# Patient Record
Sex: Male | Born: 1940 | Race: White | Hispanic: No | Marital: Married | Smoking: Former smoker
Health system: Southern US, Community
[De-identification: ages and names within clinical notes are randomized; demographics above are authoritative.]

## PROBLEM LIST (undated history)

## (undated) DIAGNOSIS — N138 Other obstructive and reflux uropathy: Secondary | ICD-10-CM

## (undated) DIAGNOSIS — N401 Enlarged prostate with lower urinary tract symptoms: Secondary | ICD-10-CM

## (undated) DIAGNOSIS — R972 Elevated prostate specific antigen [PSA]: Secondary | ICD-10-CM

## (undated) DIAGNOSIS — K219 Gastro-esophageal reflux disease without esophagitis: Secondary | ICD-10-CM

## (undated) DIAGNOSIS — N200 Calculus of kidney: Secondary | ICD-10-CM

## (undated) DIAGNOSIS — Z8639 Personal history of other endocrine, nutritional and metabolic disease: Secondary | ICD-10-CM

## (undated) DIAGNOSIS — E785 Hyperlipidemia, unspecified: Secondary | ICD-10-CM

## (undated) DIAGNOSIS — I10 Essential (primary) hypertension: Secondary | ICD-10-CM

## (undated) HISTORY — PX: CYSTOSCOPY W/ LITHOLAPAXY / EHL: SUR377

## (undated) HISTORY — DX: Hyperlipidemia, unspecified: E78.5

## (undated) HISTORY — DX: Personal history of other endocrine, nutritional and metabolic disease: Z86.39

## (undated) HISTORY — DX: Gastro-esophageal reflux disease without esophagitis: K21.9

## (undated) HISTORY — DX: Other obstructive and reflux uropathy: N13.8

## (undated) HISTORY — PX: PROSTATE BIOPSY: SHX241

## (undated) HISTORY — DX: Elevated prostate specific antigen (PSA): R97.20

## (undated) HISTORY — DX: Benign prostatic hyperplasia with lower urinary tract symptoms: N40.1

## (undated) HISTORY — DX: Essential (primary) hypertension: I10

## (undated) HISTORY — DX: Calculus of kidney: N20.0

## (undated) HISTORY — PX: CATARACT EXTRACTION: SUR2

---

## 2012-10-22 DIAGNOSIS — R972 Elevated prostate specific antigen [PSA]: Secondary | ICD-10-CM

## 2012-10-22 HISTORY — DX: Elevated prostate specific antigen (PSA): R97.20

## 2012-10-28 DIAGNOSIS — N138 Other obstructive and reflux uropathy: Secondary | ICD-10-CM

## 2012-10-28 HISTORY — DX: Other obstructive and reflux uropathy: N13.8

## 2014-10-06 DIAGNOSIS — E538 Deficiency of other specified B group vitamins: Secondary | ICD-10-CM | POA: Insufficient documentation

## 2014-10-06 DIAGNOSIS — Z8639 Personal history of other endocrine, nutritional and metabolic disease: Secondary | ICD-10-CM | POA: Insufficient documentation

## 2014-10-06 HISTORY — DX: Personal history of other endocrine, nutritional and metabolic disease: Z86.39

## 2014-11-16 DIAGNOSIS — K219 Gastro-esophageal reflux disease without esophagitis: Secondary | ICD-10-CM

## 2014-11-16 DIAGNOSIS — E785 Hyperlipidemia, unspecified: Secondary | ICD-10-CM

## 2014-11-16 DIAGNOSIS — I10 Essential (primary) hypertension: Secondary | ICD-10-CM

## 2014-11-16 DIAGNOSIS — M5136 Other intervertebral disc degeneration, lumbar region: Secondary | ICD-10-CM | POA: Insufficient documentation

## 2014-11-16 DIAGNOSIS — R0789 Other chest pain: Secondary | ICD-10-CM | POA: Insufficient documentation

## 2014-11-16 HISTORY — DX: Gastro-esophageal reflux disease without esophagitis: K21.9

## 2014-11-16 HISTORY — DX: Essential (primary) hypertension: I10

## 2014-11-16 HISTORY — DX: Hyperlipidemia, unspecified: E78.5

## 2015-07-05 DIAGNOSIS — N200 Calculus of kidney: Secondary | ICD-10-CM

## 2015-07-05 HISTORY — DX: Calculus of kidney: N20.0

## 2017-11-26 ENCOUNTER — Other Ambulatory Visit: Payer: Medicare Other

## 2017-11-26 DIAGNOSIS — R972 Elevated prostate specific antigen [PSA]: Secondary | ICD-10-CM

## 2017-11-27 LAB — PSA: PROSTATE SPECIFIC AG, SERUM: 5.2 ng/mL — AB (ref 0.0–4.0)

## 2017-11-29 ENCOUNTER — Encounter: Payer: Self-pay | Admitting: Urology

## 2017-11-29 ENCOUNTER — Ambulatory Visit
Admission: RE | Admit: 2017-11-29 | Discharge: 2017-11-29 | Disposition: A | Discharge status: Home / Self Care | Payer: Medicare Other | Source: Ambulatory Visit | Attending: Urology | Admitting: Urology

## 2017-11-29 ENCOUNTER — Ambulatory Visit: Payer: Medicare Other | Admitting: Urology

## 2017-11-29 VITALS — BP 146/82 | HR 73 | Ht 74.0 in | Wt 203.0 lb

## 2017-11-29 DIAGNOSIS — N200 Calculus of kidney: Secondary | ICD-10-CM

## 2017-11-29 DIAGNOSIS — R972 Elevated prostate specific antigen [PSA]: Secondary | ICD-10-CM

## 2017-11-29 NOTE — Progress Notes (Signed)
11/29/2017 12:34 PM   Cristian Wolfe. Jul 28, 1941 [id number]  Referring provider: No referring provider defined for this encounter.  Chief Complaint  Patient presents with  . Elevated PSA    1year  . Nephrolithiasis   Urologic problem list:  -Elevated PSA (prostate biopsy 2006 for PSA 6.63 with benign pathology; prostate volume 56 cc)  -Nephrolithiasis (nonobstructing bilateral renal calculi)  HPI: 77 year old male presents for follow-up of the above problem list.  I last saw him at [facility] on 11/27/2016.  He has no bothersome lower urinary tract symptoms.  Denies dysuria or gross hematuria.  Denies flank, abdominal, pelvic or scrotal pain.  PSA last year was 5.31.  His most recent PSA drawn on 11/26/2017 is stable at 5.2.  He had been on doxazosin but was having side effects.  His doxazosin dose was decreased and he was recently started on finasteride by Dr. Judithann Sheen.   PMH: Past Medical History:  Diagnosis Date  . 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 Elevated prostate specific antigen (PSA) 10/22/2012  . Gastro-esophageal reflux disease without esophagitis 11/16/2014  . Hyperlipidemia 11/16/2014  . Hypertension 11/16/2014  . Nephrolithiasis 07/05/2015  . Personal history of other endocrine, nutritional and metabolic disease 4/0/9811    Surgical History: Past Surgical History:  Procedure Laterality Date  . CATARACT EXTRACTION    . CYSTOSCOPY W/ LITHOLAPAXY / EHL    . PROSTATE BIOPSY      Home Medications:  Allergies as of 11/29/2017      Reactions   Iodinated Diagnostic Agents    Other reaction(s): UNKNOWN      Medication List        Accurate as of 11/29/17 12:34 PM. Always use your most recent med list.          aspirin EC 81 MG tablet Take by mouth.   BYSTOLIC 10 MG tablet Generic drug:  nebivolol   D-5000 5000 units Tabs Generic drug:  Cholecalciferol Take by mouth.   doxazosin 1 MG tablet Commonly known as:  CARDURA Take by mouth.     finasteride 5 MG tablet Commonly known as:  PROSCAR Take by mouth.   ranitidine 75 MG tablet Commonly known as:  ZANTAC Take by mouth.   sildenafil 20 MG tablet Commonly known as:  REVATIO 2-5 tablets daily as needed for ED   vitamin B-12 1000 MCG tablet Commonly known as:  CYANOCOBALAMIN Take by mouth.       Allergies:  Allergies  Allergen Reactions  . Iodinated Diagnostic Agents     Other reaction(s): UNKNOWN    Family History: No family history on file.  Social History:  reports that he has quit smoking. He has never used smokeless tobacco. He reports that he drinks alcohol. He reports that he does not use drugs.  ROS: UROLOGY Frequent Urination?: No Hard to postpone urination?: No Burning/pain with urination?: No Get up at night to urinate?: Yes Leakage of urine?: No Urine stream starts and stops?: No Trouble starting stream?: No Do you have to strain to urinate?: No Blood in urine?: No Urinary tract infection?: No Sexually transmitted disease?: No Injury to kidneys or bladder?: No Painful intercourse?: No Weak stream?: No Erection problems?: No Penile pain?: No  Gastrointestinal Nausea?: No Vomiting?: No Indigestion/heartburn?: No Diarrhea?: No Constipation?: No  Constitutional Fever: No Night sweats?: Yes Weight loss?: No Fatigue?: No  Skin Skin rash/lesions?: No Itching?: No  Eyes Blurred vision?: No Double vision?: No  Ears/Nose/Throat Sore throat?: No Sinus problems?:  No  Hematologic/Lymphatic Swollen glands?: No Easy bruising?: No  Cardiovascular Leg swelling?: No Chest pain?: No  Respiratory Cough?: No Shortness of breath?: No  Endocrine Excessive thirst?: No  Musculoskeletal Back pain?: No Joint pain?: No  Neurological Headaches?: No Dizziness?: No  Psychologic Depression?: No Anxiety?: No  Physical Exam: BP (!) 146/82   Pulse 73   Ht 6\' 2"  (1.88 m)   Wt 203 lb (92.1 kg)   BMI 26.06 kg/m    Constitutional:  Alert and oriented, No acute distress. HEENT: [place] AT, moist mucus membranes.  Trachea midline, no masses. Cardiovascular: No clubbing, cyanosis, or edema. Respiratory: Normal respiratory effort, no increased work of breathing. GI: Abdomen is soft, nontender, nondistended, no abdominal masses GU: No CVA tenderness.  Prostate 60 g, smooth without nodules Lymph: No cervical or inguinal lymphadenopathy. Skin: No rashes, bruises or suspicious lesions. Neurologic: Grossly intact, no focal deficits, moving all 4 extremities. Psychiatric: Normal mood and affect.  Laboratory Data:  Urinalysis Dipstick/microscopy negative   Assessment & Plan:   77 year old male with an elevated PSA and previous benign prostate biopsy.  DRE is benign.  He was recently placed on finasteride and his most recent PSA has not reflected in expected 50% decrease.  Will recheck his PSA in 4 to 6 months.  KUB was ordered for follow-up of nephrolithiasis.   Return in about 1 year (around 11/30/2018) for Recheck, PSA, KUB.    Riki AltesScott C Jaishawn Witzke, MD  [facility] [address], [address] [place], [place] [address] [phone]

## 2017-11-30 ENCOUNTER — Telehealth: Payer: Self-pay | Admitting: Family Medicine

## 2017-11-30 ENCOUNTER — Encounter: Payer: Self-pay | Admitting: Family Medicine

## 2017-11-30 LAB — URINALYSIS, COMPLETE
Bilirubin, UA: NEGATIVE
GLUCOSE, UA: NEGATIVE
KETONES UA: NEGATIVE
LEUKOCYTES UA: NEGATIVE
NITRITE UA: NEGATIVE
PROTEIN UA: NEGATIVE
RBC UA: NEGATIVE
SPEC GRAV UA: 1.02 (ref 1.005–1.030)
Urobilinogen, Ur: 0.2 mg/dL (ref 0.2–1.0)
pH, UA: 6.5 (ref 5.0–7.5)

## 2017-11-30 NOTE — Telephone Encounter (Signed)
-----   Message from Riki AltesScott C Stoioff, MD sent at 11/30/2017  1:04 PM EDT ----- KUB showed stable, nonobstructing renal calculi.

## 2017-11-30 NOTE — Telephone Encounter (Signed)
Letter sent.

## 2018-11-20 ENCOUNTER — Telehealth: Payer: Self-pay | Admitting: Urology

## 2018-11-20 DIAGNOSIS — N200 Calculus of kidney: Secondary | ICD-10-CM

## 2018-11-20 NOTE — Telephone Encounter (Signed)
Patient has an upcoming app and needs a kub but there is no order. Can you put an order in please and thank you?   Cristian Wolfe

## 2018-12-02 ENCOUNTER — Other Ambulatory Visit: Payer: Self-pay | Admitting: Urology

## 2018-12-02 ENCOUNTER — Other Ambulatory Visit: Payer: Self-pay

## 2018-12-02 ENCOUNTER — Other Ambulatory Visit: Payer: Medicare Other

## 2018-12-02 ENCOUNTER — Ambulatory Visit
Admission: RE | Admit: 2018-12-02 | Discharge: 2018-12-02 | Disposition: A | Discharge status: Home / Self Care | Payer: Medicare Other | Attending: Urology | Admitting: Urology

## 2018-12-02 ENCOUNTER — Ambulatory Visit
Admission: RE | Admit: 2018-12-02 | Discharge: 2018-12-02 | Disposition: A | Discharge status: Home / Self Care | Payer: Medicare Other | Source: Ambulatory Visit | Attending: Urology | Admitting: Urology

## 2018-12-02 DIAGNOSIS — N200 Calculus of kidney: Secondary | ICD-10-CM | POA: Insufficient documentation

## 2018-12-02 DIAGNOSIS — R972 Elevated prostate specific antigen [PSA]: Secondary | ICD-10-CM

## 2018-12-03 LAB — PSA: Prostate Specific Ag, Serum: 6.8 ng/mL — ABNORMAL HIGH (ref 0.0–4.0)

## 2018-12-04 ENCOUNTER — Other Ambulatory Visit: Payer: Self-pay

## 2018-12-04 ENCOUNTER — Telehealth (INDEPENDENT_AMBULATORY_CARE_PROVIDER_SITE_OTHER): Payer: Medicare Other | Admitting: Urology

## 2018-12-04 DIAGNOSIS — N200 Calculus of kidney: Secondary | ICD-10-CM

## 2018-12-04 DIAGNOSIS — R972 Elevated prostate specific antigen [PSA]: Secondary | ICD-10-CM

## 2018-12-04 NOTE — Progress Notes (Signed)
Virtual Visit via Telephone Note  I connected with Army Fossa. on 12/04/18 at  9:00 AM EDT by telephone and verified that I am speaking with the correct person using two identifiers.   I discussed the limitations, risks, security and privacy concerns of performing an evaluation and management service by telephone and the availability of in person appointments. We discussed the impact of the COVID-19 on the healthcare system, and the importance of social distancing and reducing patient and provider exposure. I also discussed with the patient that there may be a patient responsible charge related to this service. The patient expressed understanding and agreed to proceed.  Urologic history: 1.  Elevated PSA  -Prostate biopsy 2006 for PSA 6.63 with benign pathology; prostate volume 56 cc  2.  Nephrolithiasis  -nonobstructing bilateral renal calculi  Reason for visit: Annual follow-up via telephone secondary to COVID-19 pandemic.  He did not have capability for a video visit.  History of Present Illness: 78 year old male followed for the above problem list.  He has had no problems since his last visit 1 year ago and states he is doing well.  He denies bothersome lower urinary tract symptoms. Denies dysuria, gross hematuria or flank/abdominal/pelvic/scrotal pain.  PSA drawn 12/02/2018 was 6.8.  KUB performed on the same date was reviewed.  He has small, bilateral renal calculi.  There does appear to be interval size change in a left midpole calculus.  There is a moderate amount of overlying stool and bowel gas.   Assessment and Plan: 1.  Elevated PSA  -His most recent PSA is slightly above baseline however over the years his PSA has fluctuated intermittently and was as high as 8.74 in 2016.  He desires to continue surveillance.  2.  Nephrolithiasis  -There does appear to be interval size change of a left midpole calculus.  He is asymptomatic.  I recommended scheduling a renal ultrasound  for further characterization.  Will schedule in the next 1 to 2 months and call with results.  Follow Up: As above   I discussed the assessment and treatment plan with the patient. The patient was provided an opportunity to ask questions and all were answered. The patient agreed with the plan and demonstrated an understanding of the instructions.   The patient was advised to call back or seek an in-person evaluation if the symptoms worsen or if the condition fails to improve as anticipated.  I provided 11 minutes of non-face-to-face time during this encounter.   Riki Altes, MD

## 2019-01-03 ENCOUNTER — Other Ambulatory Visit: Payer: Self-pay

## 2019-01-03 ENCOUNTER — Ambulatory Visit
Admission: RE | Admit: 2019-01-03 | Discharge: 2019-01-03 | Disposition: A | Discharge status: Home / Self Care | Payer: Medicare Other | Source: Ambulatory Visit | Attending: Urology | Admitting: Urology

## 2019-01-03 DIAGNOSIS — N200 Calculus of kidney: Secondary | ICD-10-CM | POA: Insufficient documentation

## 2019-01-04 ENCOUNTER — Other Ambulatory Visit: Payer: Self-pay | Admitting: Urology

## 2019-01-04 DIAGNOSIS — N200 Calculus of kidney: Secondary | ICD-10-CM

## 2019-01-06 ENCOUNTER — Telehealth: Payer: Self-pay | Admitting: Family Medicine

## 2019-01-06 NOTE — Telephone Encounter (Signed)
-----   Message from Riki Altes, MD sent at 01/04/2019  2:23 PM EDT ----- The left renal calculus has increased in size but not to the degree as suggested by his KUB.  We could treat with shockwave lithotripsy however continued surveillance is also an option as he is asymptomatic.  If he desires surveillance I would recommend a repeat KUB in 6 months and will call with results.

## 2019-01-06 NOTE — Telephone Encounter (Signed)
Patient notified and is not having symptoms at this time. He will plan to do the KUB in 6 months

## 2019-08-07 ENCOUNTER — Telehealth: Payer: Self-pay | Admitting: Urology

## 2019-08-07 NOTE — Telephone Encounter (Signed)
Patient sent a letter to you stating that he had recent labs done on 07-17-19 at Dr. Doy Hutching office and his PSA was 8.51. he said he was told that he needed to see you. He has a follow up app with you for labs and and OV in April. Does he need to see you before this? Please advise. He is supposed to be going for his follow up 6 month KUB in the next few days. I will put the letter on your desk   Sharyn Lull

## 2019-08-07 NOTE — Telephone Encounter (Signed)
The PSA is only slightly elevated above baseline.  We can repeat at his April appointment and he does not need to be seen earlier.

## 2019-08-11 ENCOUNTER — Ambulatory Visit
Admission: RE | Admit: 2019-08-11 | Discharge: 2019-08-11 | Disposition: A | Discharge status: Home / Self Care | Payer: Medicare PPO | Source: Ambulatory Visit | Attending: Urology | Admitting: Urology

## 2019-08-11 DIAGNOSIS — N200 Calculus of kidney: Secondary | ICD-10-CM | POA: Insufficient documentation

## 2019-08-16 ENCOUNTER — Other Ambulatory Visit: Payer: Self-pay | Admitting: Urology

## 2019-08-16 DIAGNOSIS — N210 Calculus in bladder: Secondary | ICD-10-CM

## 2019-08-18 ENCOUNTER — Telehealth: Payer: Self-pay

## 2019-08-18 NOTE — Telephone Encounter (Signed)
-----   Message from Riki Altes, MD sent at 08/16/2019 10:45 AM EST ----- KUB was reviewed.  I do not see the larger left lower pole renal calculus previously noted on KUB and ultrasound however there is a new calcification in the bladder region which is about the size of the prior stone seen in the kidney.  Recommend scheduling a ultrasound pelvis to see if the stone is now in the bladder.  Order was entered and will call with results.

## 2019-08-18 NOTE — Telephone Encounter (Signed)
Called pt informed him of the information below. Pt gave verbal understanding.  

## 2019-08-25 ENCOUNTER — Other Ambulatory Visit: Payer: Self-pay

## 2019-08-25 ENCOUNTER — Ambulatory Visit
Admission: RE | Admit: 2019-08-25 | Discharge: 2019-08-25 | Disposition: A | Discharge status: Home / Self Care | Payer: Medicare PPO | Source: Ambulatory Visit | Attending: Urology | Admitting: Urology

## 2019-08-25 DIAGNOSIS — N210 Calculus in bladder: Secondary | ICD-10-CM | POA: Insufficient documentation

## 2019-09-08 ENCOUNTER — Telehealth: Payer: Self-pay | Admitting: Urology

## 2019-09-08 NOTE — Telephone Encounter (Signed)
LM FOR PT TO CB TO CONFIRM APP  Cristian Wolfe

## 2019-09-08 NOTE — Telephone Encounter (Signed)
-----   Message from Riki Altes, MD sent at 09/07/2019 12:03 PM EST ----- Bladder ultrasound does show a 10 mm stone in the bladder.  Please set up a telephone visit to discuss options.  It does not have to be at the beginning or end of morning/afternoon

## 2019-09-10 ENCOUNTER — Other Ambulatory Visit: Payer: Self-pay

## 2019-09-10 ENCOUNTER — Telehealth (INDEPENDENT_AMBULATORY_CARE_PROVIDER_SITE_OTHER): Payer: Medicare PPO | Admitting: Urology

## 2019-09-10 DIAGNOSIS — N138 Other obstructive and reflux uropathy: Secondary | ICD-10-CM | POA: Diagnosis not present

## 2019-09-10 DIAGNOSIS — N401 Enlarged prostate with lower urinary tract symptoms: Secondary | ICD-10-CM

## 2019-09-10 DIAGNOSIS — N210 Calculus in bladder: Secondary | ICD-10-CM | POA: Diagnosis not present

## 2019-09-10 NOTE — Progress Notes (Signed)
Virtual Visit via Telephone Note  I connected with Cristian Wolfe. on 09/10/19 at  2:15 PM EST by telephone and verified that I am speaking with the correct person using two identifiers.  Location: Patient: Home Provider: Office, [facility]   I discussed the limitations, risks, security and privacy concerns of performing an evaluation and management service by telephone and the availability of in person appointments. I also discussed with the patient that there may be a patient responsible charge related to this service. The patient expressed understanding and agreed to proceed.   History of Present Illness: 79 y.o. male followed for nonobstructing renal calculi, elevated PSA and minimally symptomatic BPH.  On a recent KUB a new calcification was seen in the true bony pelvis suspicious for a bladder calculus.  A pelvic ultrasound did show this to be a stone which measures approximately 10 mm.  This looks like it may have migrated from the left kidney on x-ray/renal ultrasound performed March/April 2020.  He did not have any flank pain.  He has no voiding symptoms and denies gross hematuria. He did undergo cystolitholapaxy at [facility] 2016 but declined an outlet procedure.   Observations/Objective: N/A  Assessment and Plan: Asymptomatic bladder calculus.  We discussed options of cystolitholapaxy with an outlet procedure, cystolitholapaxy alone and observation which was not recommended as I think it is unlikely he will pass the stone.  He would like to think over these options.  Follow Up Instructions: Follow-up scheduled April 2021.  We will obtain a KUB prior to that appointment.  He will call earlier for any worsening lower urinary tract symptoms.   I discussed the assessment and treatment plan with the patient. The patient was provided an opportunity to ask questions and all were answered. The patient agreed with the plan and demonstrated an understanding of the instructions.    The patient was advised to call back or seek an in-person evaluation if the symptoms worsen or if the condition fails to improve as anticipated.  I provided 13 minutes of non-face-to-face time during this encounter.   Riki Altes, MD

## 2019-11-28 ENCOUNTER — Other Ambulatory Visit: Payer: Self-pay | Admitting: Family Medicine

## 2019-11-28 DIAGNOSIS — R972 Elevated prostate specific antigen [PSA]: Secondary | ICD-10-CM

## 2019-12-01 ENCOUNTER — Other Ambulatory Visit: Payer: Self-pay

## 2019-12-01 ENCOUNTER — Other Ambulatory Visit: Payer: Medicare PPO

## 2019-12-01 DIAGNOSIS — R972 Elevated prostate specific antigen [PSA]: Secondary | ICD-10-CM

## 2019-12-02 LAB — PSA: Prostate Specific Ag, Serum: 8.1 ng/mL — ABNORMAL HIGH (ref 0.0–4.0)

## 2019-12-04 ENCOUNTER — Ambulatory Visit
Admission: RE | Admit: 2019-12-04 | Discharge: 2019-12-04 | Disposition: A | Discharge status: Home / Self Care | Payer: Medicare PPO | Source: Ambulatory Visit | Attending: Urology | Admitting: Urology

## 2019-12-04 ENCOUNTER — Other Ambulatory Visit: Payer: Self-pay

## 2019-12-04 ENCOUNTER — Other Ambulatory Visit: Payer: Self-pay | Admitting: *Deleted

## 2019-12-04 ENCOUNTER — Telehealth (INDEPENDENT_AMBULATORY_CARE_PROVIDER_SITE_OTHER): Payer: Medicare PPO | Admitting: Urology

## 2019-12-04 ENCOUNTER — Encounter: Payer: Self-pay | Admitting: Urology

## 2019-12-04 DIAGNOSIS — R972 Elevated prostate specific antigen [PSA]: Secondary | ICD-10-CM | POA: Diagnosis not present

## 2019-12-04 DIAGNOSIS — N210 Calculus in bladder: Secondary | ICD-10-CM | POA: Diagnosis present

## 2019-12-04 DIAGNOSIS — N200 Calculus of kidney: Secondary | ICD-10-CM

## 2019-12-04 NOTE — Progress Notes (Signed)
Virtual Visit via Telephone Note  I connected with Cristian Wolfe. on 12/04/19 at 11:30 AM EDT by telephone and verified that I am speaking with the correct person using two identifiers.  Location: Patient: Home Provider: [facility]   I discussed the limitations, risks, security and privacy concerns of performing an evaluation and management service by telephone and the availability of in person appointments. I also discussed with the patient that there may be a patient responsible charge related to this service. The patient expressed understanding and agreed to proceed.   Urologic history: 1.  Elevated PSA -Prostate biopsy 2006 for PSA 6.63 with benign pathology; prostate volume 56 cc -Prostate MRI 03/2015 for bump PSA to 8.74 -Volume 122 cc; no adenopathy -PSA returned to baseline  2.  Nephrolithiasis -nonobstructing bilateral renal calculi  3.  Bladder calculi -Cystolitholapaxy 2 bladder calculi 06/2015 [facility]) -10 mm bladder calculus January 2021   History of Present Illness: 79 y.o. male presents for follow-up of an elevated PSA and recently diagnosed bladder calculus.  He has mild urinary frequency.  No dysuria, gross hematuria or obstructive voiding symptoms.  PSA trend:         KUB performed today was reviewed and there is a 10 mm calcification overlying the true bony pelvis which on previous ultrasound was found to be a bladder calculus.  Observations/Objective: N/A  Assessment and Plan:  - Bladder calculus Although presently asymptomatic I did inform Cristian Wolfe he will eventually need to have stone treated with cystolitholapaxy.  He would like to hold off until late summer or Fall.  Will check a KUB in approximately 3 months to evaluate for any interval size increase.  - Elevated PSA Most recent PSA was 8.1 which is lower than a PSA performed by his PCP in December 2020.  He had an MRI in 2016 for PSA bumped 8.74 which showed no suspicious  lesions.  He would like to continue surveillance and will recheck his PSA in approximately 6 months.   Follow Up Instructions: -KUB 3 months -PSA 6 months   I discussed the assessment and treatment plan with the patient. The patient was provided an opportunity to ask questions and all were answered. The patient agreed with the plan and demonstrated an understanding of the instructions.   The patient was advised to call back or seek an in-person evaluation if the symptoms worsen or if the condition fails to improve as anticipated.  I spent 35 total minutes on the day of the encounter including pre-visit review of the medical record, face-to-face time with the patient, and post visit ordering of labs/imaging/tests.    Cristian Altes, MD

## 2019-12-05 ENCOUNTER — Telehealth: Payer: Self-pay | Admitting: Urology

## 2019-12-05 NOTE — Telephone Encounter (Signed)
-----   Message from Riki Altes, MD sent at 12/04/2019  9:36 PM EDT ----- KUB in 3 months-will call with results oOffice visit/PSA 6 months

## 2019-12-05 NOTE — Telephone Encounter (Signed)
apps made and mailed to patient ° °Michelle °

## 2020-02-11 ENCOUNTER — Ambulatory Visit
Admission: RE | Admit: 2020-02-11 | Discharge: 2020-02-11 | Disposition: A | Discharge status: Home / Self Care | Payer: Medicare PPO | Attending: Urology | Admitting: Urology

## 2020-02-11 ENCOUNTER — Ambulatory Visit
Admission: RE | Admit: 2020-02-11 | Discharge: 2020-02-11 | Disposition: A | Discharge status: Home / Self Care | Payer: Medicare PPO | Source: Ambulatory Visit | Attending: Urology | Admitting: Urology

## 2020-02-11 DIAGNOSIS — N200 Calculus of kidney: Secondary | ICD-10-CM | POA: Diagnosis present

## 2020-02-11 DIAGNOSIS — N210 Calculus in bladder: Secondary | ICD-10-CM | POA: Insufficient documentation

## 2020-02-13 ENCOUNTER — Telehealth: Payer: Self-pay | Admitting: Family Medicine

## 2020-02-13 NOTE — Telephone Encounter (Signed)
LMOM for patient to return call.

## 2020-02-13 NOTE — Telephone Encounter (Signed)
Patient notified and voiced understanding.

## 2020-02-13 NOTE — Telephone Encounter (Signed)
-----   Message from Riki Altes, MD sent at 02/13/2020  9:37 AM EDT ----- Bladder calculus is stable and has not increased in size

## 2020-06-07 ENCOUNTER — Other Ambulatory Visit: Payer: Medicare PPO

## 2020-06-08 ENCOUNTER — Other Ambulatory Visit: Payer: Medicare PPO

## 2020-06-08 ENCOUNTER — Other Ambulatory Visit: Payer: Self-pay

## 2020-06-08 DIAGNOSIS — R972 Elevated prostate specific antigen [PSA]: Secondary | ICD-10-CM

## 2020-06-09 LAB — PSA: Prostate Specific Ag, Serum: 7 ng/mL — ABNORMAL HIGH (ref 0.0–4.0)

## 2020-06-09 NOTE — Progress Notes (Signed)
06/09/2020 9:32 AM   Army Fossa [birth year] [id number]  Referring provider: Marguarite Arbour, MD [address] [facility] [place],  [place] [address] Chief Complaint  Patient presents with  . Elevated PSA   Urologic history: 1.Elevated PSA -Prostate biopsy2006 for PSA 6.63 with benign pathology; prostate volume 56 cc -Prostate MRI 03/2015 for bump PSA to 8.74 -Volume 122 cc; no adenopathy -PSA returned to baseline  2.Nephrolithiasis -nonobstructing bilateral renal calculi  3.  Bladder calculi -Cystolitholapaxy 2 bladder calculi 06/2015 [facility]) -10 mm bladder calculus January 2021  HPI: Cristian Wolfe. is a 79 y.o. male who is seen today for a 6 month follow up of bladder calculus and elevated PSA.   -KUB form 07/07/021 noted scattered renal calculi bilaterally, essentially stable. Suspected bladder calculus measuring 1 x 1 cm. No bowel obstruction or free air evident. -Patient is doing well today. -He reports drinking plenty water with associated frequency. -Denies hematuria and dysuria. -No flank, abdominal or pelvic pain.  -PSA is 7.0 as of 06/08/2020.   PSA trend: 11/16/14: 6.3 03/22/15: 8.74 11/22/15/: 4.81 11/27/16: 5.31 05/25/17: 5.35 11/26/17: 5.2 12/02/18: 6.8 07/17/19: 8.51 12/01/19: 8.1 06/08/20: 7.0   PMH: Past Medical History:  Diagnosis Date  . 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 Elevated prostate specific antigen (PSA) 10/22/2012  . Gastro-esophageal reflux disease without esophagitis 11/16/2014  . Hyperlipidemia 11/16/2014  . Hypertension 11/16/2014  . Nephrolithiasis 07/05/2015  . Personal history of other endocrine, nutritional and metabolic disease 01/07/1307    Surgical History: Past Surgical History:  Procedure Laterality Date  . CATARACT EXTRACTION    . CYSTOSCOPY W/ LITHOLAPAXY / EHL    . PROSTATE BIOPSY      Home Medications:  Allergies as of 06/10/2020      Reactions   Iodinated Diagnostic Agents     Other reaction(s): UNKNOWN      Medication List       Accurate as of June 10, 2020  9:32 AM. If you have any questions, ask your nurse or doctor.        aspirin EC 81 MG tablet Take by mouth.   Bystolic 10 MG tablet Generic drug: nebivolol   D-5000 125 MCG (5000 UT) Tabs Generic drug: Cholecalciferol Take by mouth.   doxazosin 1 MG tablet Commonly known as: CARDURA Take by mouth.   gabapentin 300 MG capsule Commonly known as: NEURONTIN Take 300 mg by mouth at bedtime.   ranitidine 75 MG tablet Commonly known as: ZANTAC Take by mouth.   sildenafil 20 MG tablet Commonly known as: REVATIO 2-5 tablets daily as needed for ED   vitamin B-12 1000 MCG tablet Commonly known as: CYANOCOBALAMIN Take by mouth.       Allergies:  Allergies  Allergen Reactions  . Iodinated Diagnostic Agents     Other reaction(s): UNKNOWN    Family History: No family history on file.  Social History:  reports that he has quit smoking. He has never used smokeless tobacco. He reports current alcohol use. He reports that he does not use drugs.   Physical Exam: BP (!) 198/94   Pulse 64   Ht 6\' 2"  (1.88 m)   Wt 200 lb (90.7 kg)   BMI 25.68 kg/m   Constitutional:  Alert and oriented, No acute distress. HEENT: [place] AT, moist mucus membranes.  Trachea midline, no masses. Cardiovascular: No clubbing, cyanosis, or edema. Respiratory: Normal respiratory effort, no increased work of breathing. GU: No CVA tenderness.  50 g prostate, smooth no nodules/tenderness  Skin: No rashes, bruises or suspicious lesions. Neurologic: Grossly intact, no focal deficits, moving all 4 extremities. Psychiatric: Normal mood and affect.   I have personally reviewed the images.    Assessment & Plan:    1. Bladder calculus  Asymptomatic  KUB today  We again discussed he will eventually need cystolitholapaxy for the stone  2. Elevated PSA  PSA is 7.0 as of 06/08/2020.  RTC in 6 months with PSA  prior.  3. Nephrolithiasis  KUB today as above   [facility] [address], [address] [place], [place] [address] [phone]  I, Theador Hawthorne, am acting as a scribe for Dr. Lorin Picket C. Shaasia Odle,  I have reviewed the above documentation for accuracy and completeness, and I agree with the above.    Riki Altes, MD

## 2020-06-10 ENCOUNTER — Encounter: Payer: Self-pay | Admitting: Urology

## 2020-06-10 ENCOUNTER — Ambulatory Visit: Payer: Medicare PPO | Admitting: Urology

## 2020-06-10 ENCOUNTER — Ambulatory Visit
Admission: RE | Admit: 2020-06-10 | Discharge: 2020-06-10 | Disposition: A | Discharge status: Home / Self Care | Payer: Medicare PPO | Attending: Urology | Admitting: Urology

## 2020-06-10 ENCOUNTER — Ambulatory Visit
Admission: RE | Admit: 2020-06-10 | Discharge: 2020-06-10 | Disposition: A | Discharge status: Home / Self Care | Payer: Medicare PPO | Source: Ambulatory Visit | Attending: Urology | Admitting: Urology

## 2020-06-10 ENCOUNTER — Other Ambulatory Visit: Payer: Self-pay

## 2020-06-10 VITALS — BP 198/94 | HR 64 | Ht 74.0 in | Wt 200.0 lb

## 2020-06-10 DIAGNOSIS — N210 Calculus in bladder: Secondary | ICD-10-CM | POA: Diagnosis not present

## 2020-06-10 DIAGNOSIS — R972 Elevated prostate specific antigen [PSA]: Secondary | ICD-10-CM | POA: Diagnosis not present

## 2020-06-10 DIAGNOSIS — N200 Calculus of kidney: Secondary | ICD-10-CM

## 2020-06-13 NOTE — Progress Notes (Signed)
KUB showed stable renal calculi and stable bladder calculus.  If he does not desire treatment of his bladder calculus at this time keep scheduled follow-up May 2022

## 2020-06-14 ENCOUNTER — Telehealth: Payer: Self-pay | Admitting: *Deleted

## 2020-06-14 NOTE — Telephone Encounter (Signed)
-----   Message from Riki Altes, MD sent at 06/13/2020 10:47 AM EST ----- KUB showed stable renal calculi and stable bladder calculus.  If he does not desire treatment of his bladder calculus at this time keep scheduled follow-up May 2022

## 2020-06-14 NOTE — Telephone Encounter (Signed)
Notified patient as instructed, patient pleased. Discussed follow-up appointments, patient agrees  

## 2020-12-08 ENCOUNTER — Ambulatory Visit
Admission: RE | Admit: 2020-12-08 | Discharge: 2020-12-08 | Disposition: A | Discharge status: Home / Self Care | Payer: Medicare PPO | Attending: Urology | Admitting: Urology

## 2020-12-08 ENCOUNTER — Ambulatory Visit: Payer: Medicare PPO | Admitting: Urology

## 2020-12-08 ENCOUNTER — Other Ambulatory Visit: Payer: Self-pay

## 2020-12-08 ENCOUNTER — Encounter: Payer: Self-pay | Admitting: Urology

## 2020-12-08 ENCOUNTER — Ambulatory Visit
Admission: RE | Admit: 2020-12-08 | Discharge: 2020-12-08 | Disposition: A | Discharge status: Home / Self Care | Payer: Medicare PPO | Source: Ambulatory Visit | Attending: Urology | Admitting: Urology

## 2020-12-08 VITALS — BP 157/95 | HR 75 | Ht 74.0 in | Wt 204.0 lb

## 2020-12-08 DIAGNOSIS — R972 Elevated prostate specific antigen [PSA]: Secondary | ICD-10-CM

## 2020-12-08 DIAGNOSIS — N200 Calculus of kidney: Secondary | ICD-10-CM | POA: Insufficient documentation

## 2020-12-08 NOTE — Progress Notes (Signed)
   12/08/2020 10:29 AM   Cristian Wolfe 1940/09/28 [id number]  Referring provider: Marguarite Arbour, MD [address] [facility] [place],  [place] [address]  No chief complaint on file.   Urologic history: 1.Elevated PSA -Prostate biopsy2006 for PSA 6.63 with benign pathology; prostate volume 56 cc -Prostate MRI 03/2015 for bump PSA to 8.74 -Volume 122 cc; no adenopathy -PSA returned to baseline  2.Nephrolithiasis -nonobstructing bilateral renal calculi  3.Bladder calculi -Cystolitholapaxy2bladder calculi 06/2015 [facility]) -10 mm bladder calculus January 2021   HPI: 80 y.o. male presents for 22-month follow-up.   No voiding complaints since visit December 2021  Denies dysuria, gross hematuria  Denies flank, abdominal or pelvic pain   PMH: Past Medical History:  Diagnosis Date  . 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 Elevated prostate specific antigen (PSA) 10/22/2012  . Gastro-esophageal reflux disease without esophagitis 11/16/2014  . Hyperlipidemia 11/16/2014  . Hypertension 11/16/2014  . Nephrolithiasis 07/05/2015  . Personal history of other endocrine, nutritional and metabolic disease 11/08/1538    Surgical History: Past Surgical History:  Procedure Laterality Date  . CATARACT EXTRACTION    . CYSTOSCOPY W/ LITHOLAPAXY / EHL    . PROSTATE BIOPSY      Home Medications:  Allergies as of 12/08/2020      Reactions   Iodinated Diagnostic Agents    Other reaction(s): UNKNOWN      Medication List       Accurate as of Dec 08, 2020 10:29 AM. If you have any questions, ask your nurse or doctor.        aspirin EC 81 MG tablet Take by mouth.   Bystolic 10 MG tablet Generic drug: nebivolol   D-5000 125 MCG (5000 UT) Tabs Generic drug: Cholecalciferol Take by mouth.   doxazosin 1 MG tablet Commonly known as: CARDURA Take by mouth.   gabapentin 300 MG capsule Commonly known as: NEURONTIN Take 300 mg by mouth at  bedtime.   ranitidine 75 MG tablet Commonly known as: ZANTAC Take by mouth.   sildenafil 20 MG tablet Commonly known as: REVATIO 2-5 tablets daily as needed for ED   vitamin B-12 1000 MCG tablet Commonly known as: CYANOCOBALAMIN Take by mouth.       Allergies:  Allergies  Allergen Reactions  . Iodinated Diagnostic Agents     Other reaction(s): UNKNOWN    Family History: No family history on file.  Social History:  reports that he has quit smoking. He has never used smokeless tobacco. He reports current alcohol use. He reports that he does not use drugs.   Physical Exam: There were no vitals taken for this visit.  Constitutional:  Alert and oriented, No acute distress. HEENT: [place] AT, moist mucus membranes.  Trachea midline, no masses. Cardiovascular: No clubbing, cyanosis, or edema. Respiratory: Normal respiratory effort, no increased work of breathing.   Assessment & Plan:    1.  Elevated PSA  PSA drawn today and if stable will move to annual PSA checks  2.  Bladder calculus  Asymptomatic and we discussed at some point he will need treatment.  KUB ordered today and if size not increasing he has elected surveillance.  He will be notified with results and further recommendations   Riki Altes, MD  [facility] [address], [address] [place], [place] [address] [phone]

## 2020-12-09 ENCOUNTER — Telehealth: Payer: Self-pay | Admitting: Urology

## 2020-12-09 LAB — PSA: Prostate Specific Ag, Serum: 8.1 ng/mL — ABNORMAL HIGH (ref 0.0–4.0)

## 2020-12-09 NOTE — Telephone Encounter (Signed)
PSA stable at 8.1.  KUB reviewed and renal calculi are stable.  The bladder calculus is difficult to visualize but there does not appear to be any significant size increase.  Recommend repeat KUB 6 months

## 2020-12-13 ENCOUNTER — Encounter: Payer: Self-pay | Admitting: *Deleted

## 2020-12-13 NOTE — Telephone Encounter (Signed)
I have tried calling the patient. I sent a my chart message today .

## 2021-05-28 IMAGING — CR DG ABDOMEN 1V
1 series · 2 of 2 positions shown · non-contrast
Comparison: June 10, 2020

CLINICAL DATA: Kidney stone.

EXAM:
ABDOMEN - 1 VIEW

[Series 1: t abdomen supine · 0.14mm/px · 2 of 2 slices shown]
[im 1/2]
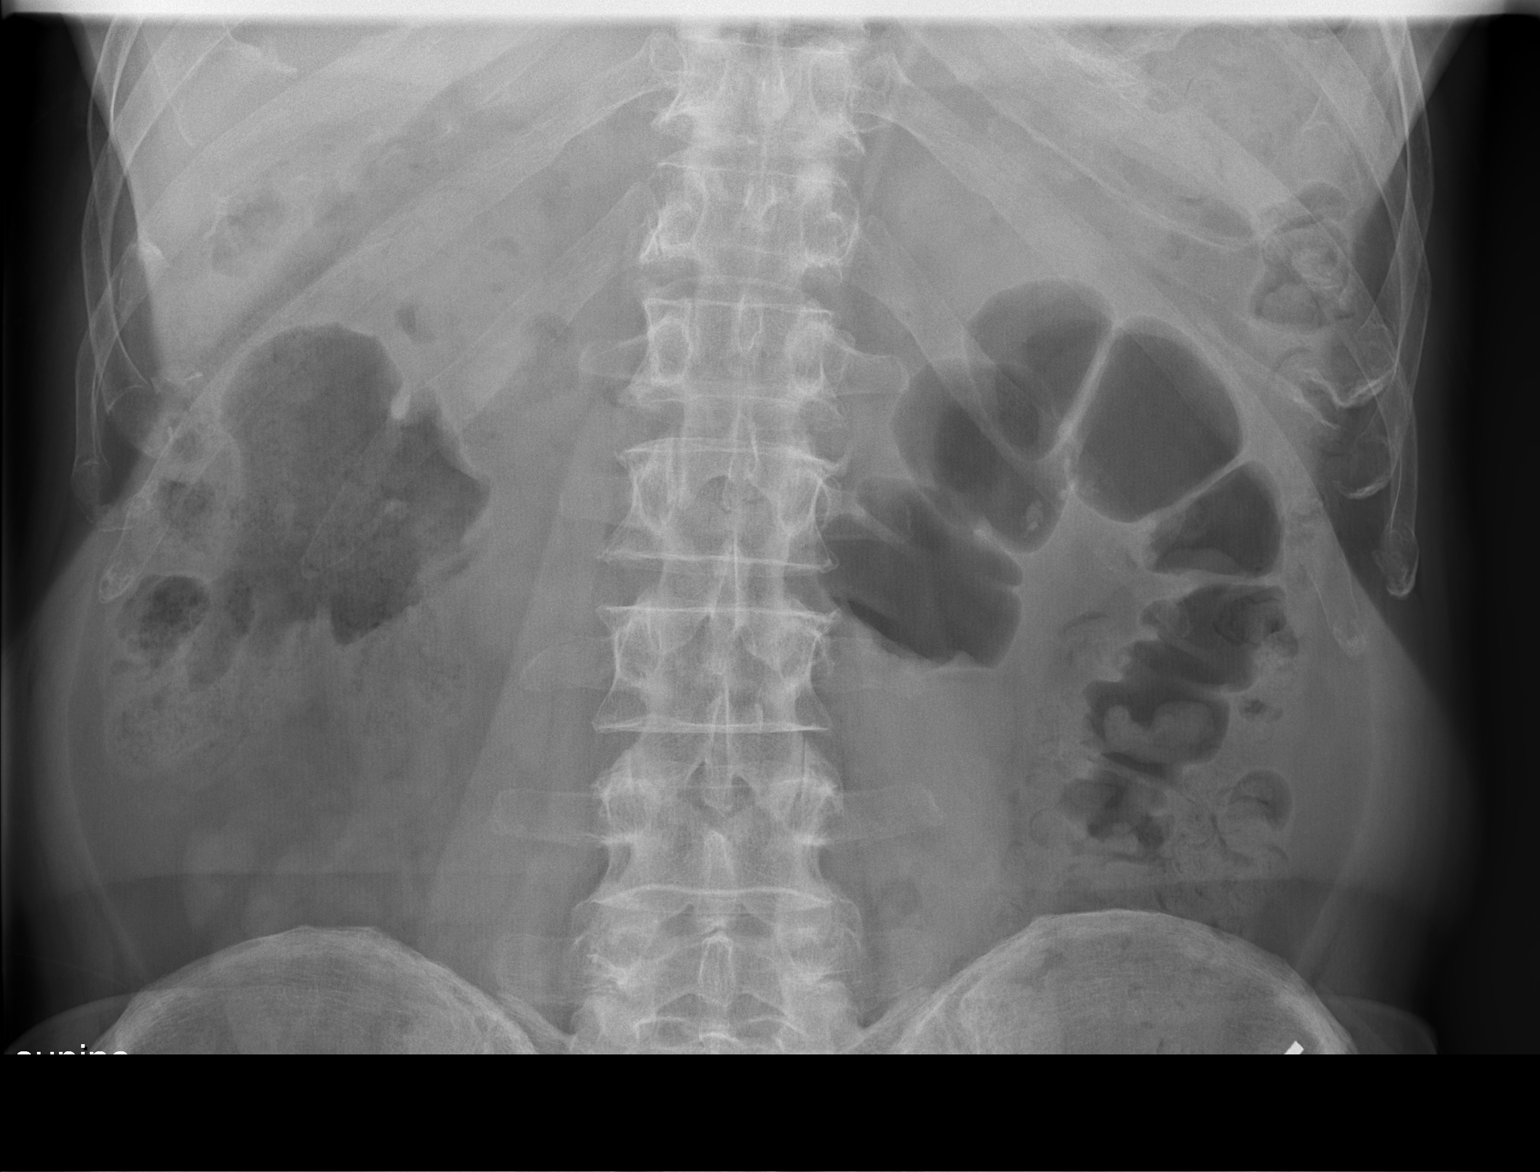
[im 2/2]
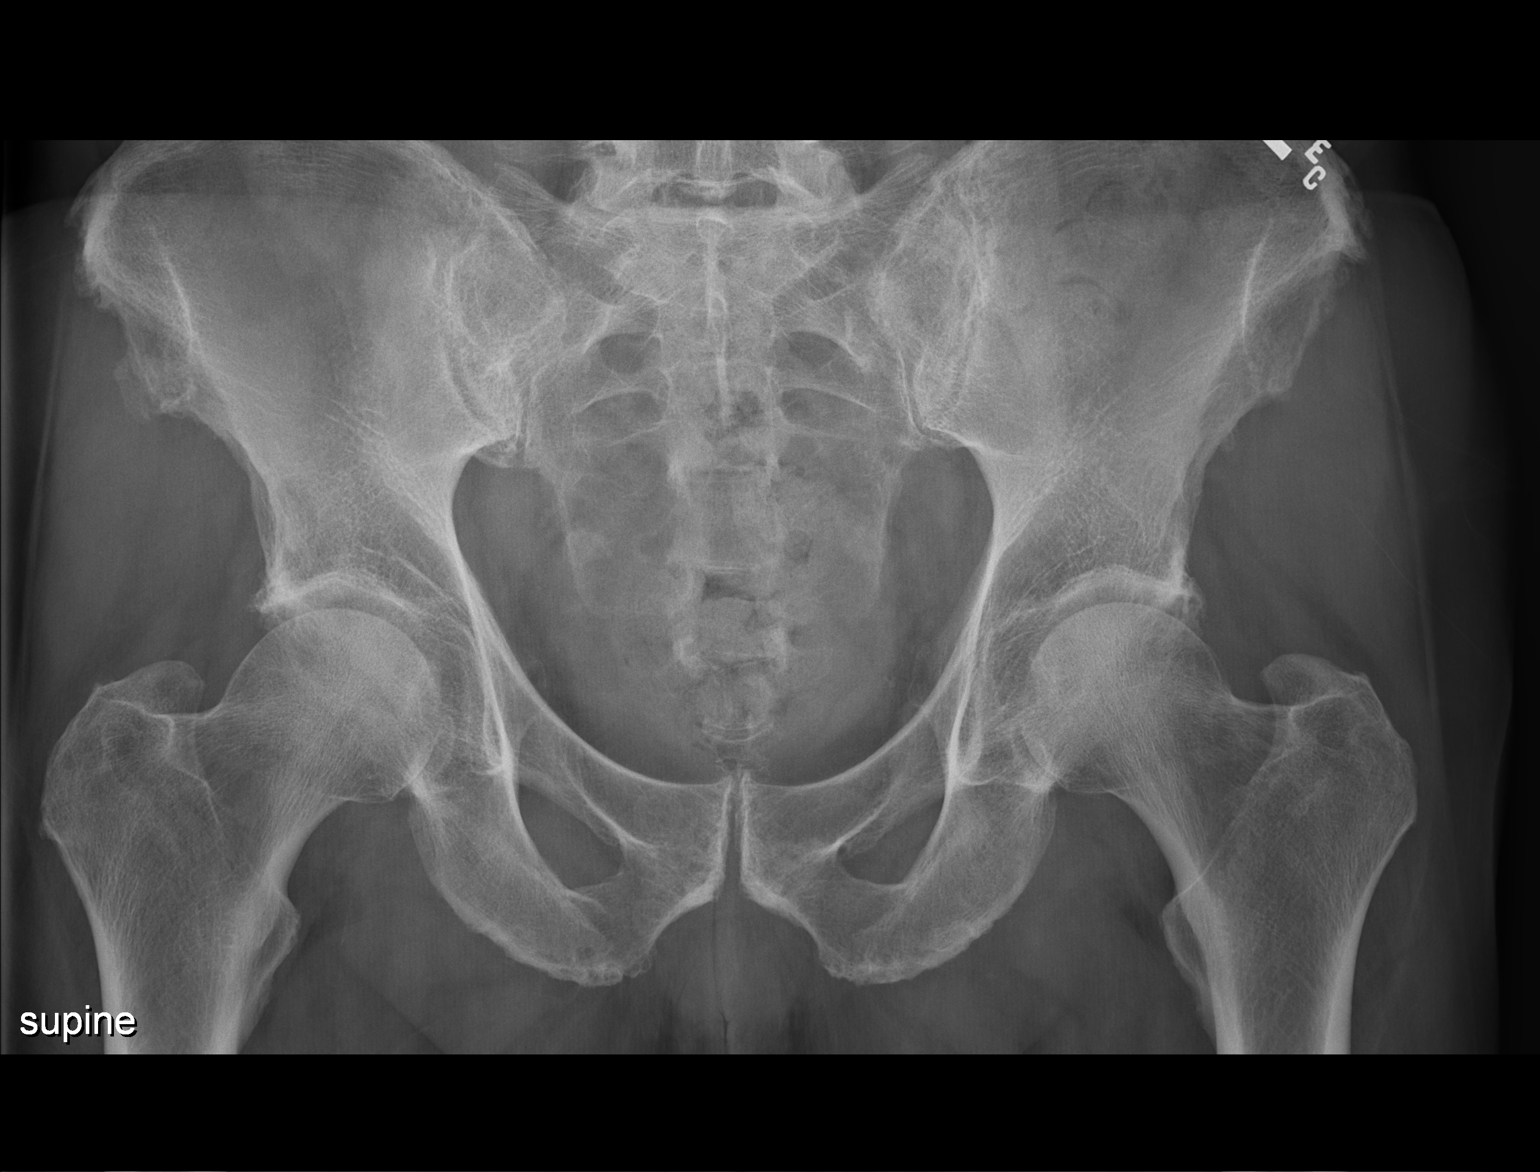

[2 of 2 positions shown; findings below may reference images not displayed]

FINDINGS: Both kidneys are partially obscured by bowel contents. The known
mm stone in the lower pole the right kidney is identified. Several
stones are again seen in the left kidney. No ureteral stones
identified. No other abnormalities.
IMPRESSION: Bilateral renal stones persist, not significantly changed.

## 2022-03-07 ENCOUNTER — Other Ambulatory Visit: Payer: Self-pay | Admitting: Neurology

## 2022-03-07 DIAGNOSIS — G3184 Mild cognitive impairment, so stated: Secondary | ICD-10-CM

## 2022-03-20 ENCOUNTER — Other Ambulatory Visit: Payer: Medicare PPO
